# Patient Record
Sex: Male | Born: 1970 | Race: White | Hispanic: No | Marital: Married | Smoking: Never smoker
Health system: Southern US, Community
[De-identification: ages and names within clinical notes are randomized; demographics above are authoritative.]

---

## 2016-06-23 ENCOUNTER — Ambulatory Visit: Payer: Self-pay | Admitting: Allergy

## 2016-12-01 ENCOUNTER — Encounter: Payer: Self-pay | Admitting: Orthopedic Surgery

## 2016-12-01 ENCOUNTER — Ambulatory Visit (INDEPENDENT_AMBULATORY_CARE_PROVIDER_SITE_OTHER): Payer: BC Managed Care – PPO

## 2016-12-01 ENCOUNTER — Ambulatory Visit (INDEPENDENT_AMBULATORY_CARE_PROVIDER_SITE_OTHER): Payer: BC Managed Care – PPO | Admitting: Orthopedic Surgery

## 2016-12-01 VITALS — BP 137/84 | HR 59 | Ht 72.0 in | Wt 215.0 lb

## 2016-12-01 DIAGNOSIS — M79671 Pain in right foot: Secondary | ICD-10-CM

## 2016-12-01 DIAGNOSIS — M7661 Achilles tendinitis, right leg: Secondary | ICD-10-CM

## 2016-12-01 NOTE — Patient Instructions (Addendum)
Achilles Tendinitis Rehab  Do 3 sets of 10 each exercise where indicated hold the count for 1 second  Do exercises once a day for 4 weeks Ask your health care provider which exercises are safe for you. Do exercises exactly as told by your health care provider and adjust them as directed. It is normal to feel mild stretching, pulling, tightness, or discomfort as you do these exercises, but you should stop right away if you feel sudden pain or your pain gets worse. Do not begin these exercises until told by your health care provider. Stretching and range of motion exercises These exercises warm up your muscles and joints and improve the movement and flexibility of your ankle. These exercises also help to relieve pain, numbness, and tingling. Exercise A: Standing wall calf stretch, knee straight 1. Stand with your hands against a wall. 2. Extend your __________ leg behind you and bend your front knee slightly. Keep both of your heels on the floor. 3. Point the toes of your back foot slightly inward. 4. Keeping your heels on the floor and your back knee straight, shift your weight toward the wall. Do not allow your back to arch. You should feel a gentle stretch in your calf. 5. Hold this position for seconds. Repeat __________ times. Complete this stretch __________ times per day. Exercise B: Standing wall calf stretch, knee bent 1. Stand with your hands against a wall. 2. Extend your __________ leg behind you, and bend your front knee slightly. Keep both of your heels on the floor. 3. Point the toes of your back foot slightly inward. 4. Keeping your heels on the floor, unlock your back knee so that it is bent. You should feel a gentle stretch deep in your calf. 5. Hold this position for __________ seconds. Repeat __________ times. Complete this stretch __________ times per day. Strengthening exercises These exercises build strength and control of your ankle. Endurance is the ability to use your  muscles for a long time, even after they get tired. Exercise C: Plantar flexion with band 1. Sit on the floor with your __________ leg extended. You may put a pillow under your calf to give your foot more room to move. 2. Loop a rubber exercise band or tube around the ball of your __________ foot. The ball of your foot is on the walking surface, right under your toes. The band or tube should be slightly tense when your foot is relaxed. If the band or tube slips, you can put on your shoe or put a washcloth between the band and your foot to help it stay in place. 3. Slowly point your toes downward, pushing them away from you. 4. Hold this position for __________ seconds. 5. Slowly release the tension in the band or tube, controlling smoothly until your foot is back to the starting position. Repeat __________ times. Complete this exercise __________ times per day. Exercise D: Heel raise with eccentric lower 1. Stand on a step with the balls of your feet. The ball of your foot is on the walking surface, right under your toes.  Do not put your heels on the step.  For balance, rest your hands on the wall or on a railing. 2. Rise up onto the balls of your feet. 3. Keeping your heels up, shift all of your weight to your __________ leg and pick up your other leg. 4. Slowly lower your __________ leg so your heel drops below the level of the step. 5. Put down  your foot. If told by your health care provider, build up to:  3 sets of 15 repetitions while keeping your knees straight.  3 sets of 15 repetitions while keeping your knees bent as far as told by your health care provider. Complete this exercise __________ times per day. If this exercise is too easy, try doing it while wearing a backpack with weights in it. Balance exercises These exercises improve or maintain your balance. Balance is important in preventing falls. Exercise E: Single leg stand 1. Without shoes, stand near a railing or in a  door frame. Hold on to the railing or door frame as needed. 2. Stand on your __________ foot. Keep your big toe down on the floor and try to keep your arch lifted. 3. Hold this position for __________ seconds. Repeat __________ times. Complete this exercise __________ times per day. If this exercise is too easy, you can try it with your eyes closed or while standing on a pillow. This information is not intended to replace advice given to you by your health care provider. Make sure you discuss any questions you have with your health care provider. Document Released: 05/20/2005 Document Revised: 06/25/2016 Document Reviewed: 06/25/2015 Elsevier Interactive Patient Education  2017 [employer]

## 2016-12-01 NOTE — Progress Notes (Signed)
Patient ID: Jeremiah Fletcher, male   DOB: 03-06-1971, 46 y.o.   MRN: [id number]  Chief Complaint  Patient presents with  . Foot Pain    RIGHT HEEL PAIN    HPI Jeremiah Fletcher is a 46 y.o. male.  The patient reports 6 month history of pain behind his right heel worse when he gets up in the morning worse after exercises. He got to the point where he couldn't walk limp for several weeks then he got a little bit better but he still having difficulty despite taking ibuprofen and resting  It is a dull aching pain is nonradiating it is not had no trauma  Review of Systems Review of Systems  Respiratory: Negative for shortness of breath.   Cardiovascular: Negative for chest pain.  Neurological: Negative for numbness.    Medical history no hypertension or diabetes  The patient reports no prior surgery  Social History Social History  Substance Use Topics  . Smoking status: Never Smoker  . Smokeless tobacco: Never Used  . Alcohol use Not on file    Allergies  Allergen Reactions  . Asa [Aspirin]     Current Meds  Medication Sig  . ALBUTEROL IN Inhale into the lungs.      Physical Exam Physical Exam BP 137/84   Pulse (!) 59   Ht 6' (1.829 m)   Wt 215 lb (97.5 kg)   BMI 29.16 kg/m   Gen. appearance. The patient is well-developed and well-nourished, grooming and hygiene are normal. There are no gross congenital abnormalities  The patient is alert and oriented to person place and time  Mood and affect are normal  Ambulation Is normal  Examination reveals the following: The right heel has a tender area on the posterior aspect of the Achilles insertion with nontender nonswollen Achilles tendon normal ankle range of motion normal stability normal strength normal skin normal sensation normal pulse and perfusion    Data Reviewed Normal foot x-ray except for spur in the Achilles  Assessment    Encounter Diagnoses  Name Primary?  . Pain of right heel Yes  . Tendonitis, Achilles,  right        Plan    Recommend ankle exercises  Ibuprofen 800 mg 3 times a day over-the-counter  Large Cam Walker  Four-week follow-up       Fuller CanadaStanley Harrison 12/01/2016, 4:27 PM

## 2016-12-29 ENCOUNTER — Ambulatory Visit: Payer: BC Managed Care – PPO | Admitting: Orthopedic Surgery

## 2017-03-13 ENCOUNTER — Other Ambulatory Visit (HOSPITAL_COMMUNITY): Payer: Self-pay | Admitting: Pulmonary Disease

## 2017-03-13 ENCOUNTER — Ambulatory Visit (HOSPITAL_COMMUNITY)
Admission: RE | Admit: 2017-03-13 | Discharge: 2017-03-13 | Disposition: A | Discharge status: Home / Self Care | Payer: BC Managed Care – PPO | Source: Ambulatory Visit | Attending: Pulmonary Disease | Admitting: Pulmonary Disease

## 2017-03-13 DIAGNOSIS — M5136 Other intervertebral disc degeneration, lumbar region: Secondary | ICD-10-CM | POA: Diagnosis not present

## 2017-03-13 DIAGNOSIS — M545 Low back pain: Secondary | ICD-10-CM

## 2017-03-24 ENCOUNTER — Ambulatory Visit: Payer: BC Managed Care – PPO | Admitting: Gastroenterology

## 2017-03-24 ENCOUNTER — Telehealth: Payer: Self-pay

## 2017-03-24 NOTE — Telephone Encounter (Signed)
Gastroenterology Pre-Procedure Review  Request Date: 03/24/2017 Requesting Physician:   PATIENT REVIEW QUESTIONS: The patient responded to the following health history questions as indicated:    1. Diabetes Melitis: NO 2. Joint replacements in the past 12 months: no 3. Major health problems in the past 3 months: no 4. Has an artificial valve or MVP: no 5. Has a defibrillator: no 6. Has been advised in past to take antibiotics in advance of a procedure like teeth cleaning: no 7. Family history of colon cancer: no  8. Alcohol Use: no 9. History of sleep apnea: no  10. History of coronary artery or other vascular stents placed within the last 12 months: no    MEDICATIONS & ALLERGIES:    Patient reports the following regarding taking any blood thinners:   Plavix? no Aspirin? no Coumadin? no Brilinta? no Xarelto? no Eliquis? no Pradaxa? no Savaysa? no Effient? no  Patient confirms/reports the following medications:  Current Outpatient Prescriptions  Medication Sig Dispense Refill  . ALBUTEROL IN Inhale into the lungs.     No current facility-administered medications for this visit.     Patient confirms/reports the following allergies:  Allergies  Allergen Reactions  . Asa [Aspirin]     No orders of the defined types were placed in this encounter.   AUTHORIZATION INFORMATION Primary Insurance:  ID #:   Group #:  Pre-Cert / Auth required:  Pre-Cert / Auth #:   Secondary Insurance:  ID #:   Group #:  Pre-Cert / Auth required:  Pre-Cert / Auth #:   SCHEDULE INFORMATION: Procedure has been scheduled as follows:  Date:   04/15/2017             Time:  1:00 pm Location: [facility] Short Stay  This Gastroenterology Pre-Precedure Review Form is being routed to the following provider(s): R. Roetta SessionsMichael Rourk, MD

## 2017-03-24 NOTE — Telephone Encounter (Signed)
Ok to schedule with rmr. Per AB, patient having rectal bleeding and RMR ok'd triage.

## 2017-03-25 ENCOUNTER — Other Ambulatory Visit: Payer: Self-pay

## 2017-03-25 DIAGNOSIS — K625 Hemorrhage of anus and rectum: Secondary | ICD-10-CM

## 2017-03-25 NOTE — Telephone Encounter (Signed)
Pt is on the schedule for 04/15/2017 at 1:00 Am with Dr. Jena Gaussourk. Instructions and Movie prep given to [facility].

## 2017-04-13 ENCOUNTER — Telehealth: Payer: Self-pay

## 2017-04-13 NOTE — Telephone Encounter (Signed)
I called [employer] and spoke with Rosemond C who said a PA is not required for the colonoscopy CPT code of [id number].

## 2017-04-15 ENCOUNTER — Ambulatory Visit (HOSPITAL_COMMUNITY)
Admission: RE | Admit: 2017-04-15 | Discharge: 2017-04-15 | Disposition: A | Discharge status: Home / Self Care | Payer: BC Managed Care – PPO | Source: Ambulatory Visit | Attending: Internal Medicine | Admitting: Internal Medicine

## 2017-04-15 ENCOUNTER — Encounter (HOSPITAL_COMMUNITY): Payer: Self-pay | Admitting: *Deleted

## 2017-04-15 ENCOUNTER — Encounter (HOSPITAL_COMMUNITY)
Admission: RE | Disposition: A | Discharge status: Home / Self Care | Payer: Self-pay | Source: Ambulatory Visit | Attending: Internal Medicine

## 2017-04-15 DIAGNOSIS — Q438 Other specified congenital malformations of intestine: Secondary | ICD-10-CM | POA: Insufficient documentation

## 2017-04-15 DIAGNOSIS — K921 Melena: Secondary | ICD-10-CM | POA: Diagnosis not present

## 2017-04-15 DIAGNOSIS — K625 Hemorrhage of anus and rectum: Secondary | ICD-10-CM

## 2017-04-15 HISTORY — PX: COLONOSCOPY: SHX5424

## 2017-04-15 SURGERY — COLONOSCOPY
Anesthesia: Moderate Sedation

## 2017-04-15 MED ORDER — MEPERIDINE HCL 100 MG/ML IJ SOLN
INTRAMUSCULAR | Status: AC
  Filled 2017-04-15: qty 2

## 2017-04-15 MED ORDER — MIDAZOLAM HCL 5 MG/5ML IJ SOLN
INTRAMUSCULAR | Status: AC
  Filled 2017-04-15: qty 10

## 2017-04-15 MED ORDER — MIDAZOLAM HCL 5 MG/5ML IJ SOLN
INTRAMUSCULAR | Status: DC | PRN
  Administered 2017-04-15 (×2): 1 mg via INTRAVENOUS
  Administered 2017-04-15: 1.5 mg via INTRAVENOUS
  Administered 2017-04-15 (×3): 2 mg via INTRAVENOUS

## 2017-04-15 MED ORDER — ONDANSETRON HCL 4 MG/2ML IJ SOLN
INTRAMUSCULAR | Status: DC | PRN
  Administered 2017-04-15: 4 mg via INTRAVENOUS

## 2017-04-15 MED ORDER — ONDANSETRON HCL 4 MG/2ML IJ SOLN
INTRAMUSCULAR | Status: AC
  Filled 2017-04-15: qty 2

## 2017-04-15 MED ORDER — SODIUM CHLORIDE 0.9 % IV SOLN
INTRAVENOUS | Status: DC
Start: 2017-04-15 — End: 2017-04-15
  Administered 2017-04-15: 13:00:00 via INTRAVENOUS

## 2017-04-15 MED ORDER — MEPERIDINE HCL 100 MG/ML IJ SOLN
INTRAMUSCULAR | Status: DC | PRN
  Administered 2017-04-15 (×3): 50 mg via INTRAVENOUS

## 2017-04-15 NOTE — Op Note (Signed)
[facility] Patient Name: Jeremiah Fletcher Procedure Date: 04/15/2017 1:08 PM MRN: [id number] Date of Birth: 1971/10/22 Attending MD: Gennette Pacobert Michael Amiliana Foutz , MD CSN: [id number] Age: [age] Admit Type: Outpatient Procedure:                Colonoscopy Indications:              Hematochezia Providers:                Gennette Pacobert Michael Meital Riehl, MD, Jannett CelestineAnitra Bell, RN, Burke Keelsrisann                            Tilley, Technician Referring MD:              Medicines:                Midazolam 9.5 mg IV, Meperidine 150 mg IV,                            Ondansetron 4 mg IV Complications:            No immediate complications. Estimated Blood Loss:     Estimated blood loss: none. Procedure:                Pre-Anesthesia Assessment:                           - Prior to the procedure, a History and Physical                            was performed, and patient medications and                            allergies were reviewed. The patient's tolerance of                            previous anesthesia was also reviewed. The risks                            and benefits of the procedure and the sedation                            options and risks were discussed with the patient.                            All questions were answered, and informed consent                            was obtained. Prior Anticoagulants: The patient has                            taken no previous anticoagulant or antiplatelet                            agents. ASA Grade Assessment: I - A normal, healthy  patient. After reviewing the risks and benefits,                            the patient was deemed in satisfactory condition to                            undergo the procedure.                           After obtaining informed consent, the colonoscope                            was passed under direct vision. Throughout the                            procedure, the patient's blood pressure, pulse, and                    oxygen saturations were monitored continuously. The                            EC-[id number] ([id number]) scope was introduced through                            the anus and advanced to the the ileocecal valve.                            The colonoscopy was technically difficult and                            complex due to inadequate bowel prep. The patient                            tolerated the procedure well. The quality of the                            bowel preparation was inadequate. The appendiceal                            orifice and the rectum were photographed. Scope In: 1:44:07 PM Scope Out: 2:17:53 PM Scope Withdrawal Time: 0 hours 12 minutes 23 seconds  Total Procedure Duration: 0 hours 33 minutes 46 seconds  Findings:      The perianal and digital rectal examinations were normal.      The colon (entire examined portion) was moderately redundant. Colon       Elongated. Viscous stool and vegetable matter was scattered throughout       the colon. This precluded complete examination.The cecum was reached.      The exam was otherwise without abnormality on direct and retroflexion       views. Impression:               - Preparation of the colon was inadequate.                           - Redundant colon. INCOMPLETE EXAM                           -  The examination was otherwise normal on direct                            and retroflexion views.                           - No specimens collected. I suspect the patient is                            suffering from significant constipation. Likely                            bled from benign anal rectal irritation However, a                            repeat colonoscopy is appropriate. Moderate Sedation:      Moderate (conscious) sedation was administered by the endoscopy nurse       and supervised by the endoscopist. The following parameters were       monitored: oxygen saturation, heart rate, blood pressure,  respiratory       rate, EKG, adequacy of pulmonary ventilation, and response to care.       Total physician intraservice time was 47 minutes. Recommendation:           - Patient has a contact number available for                            emergencies. The signs and symptoms of potential                            delayed complications were discussed with the                            patient. Return to normal activities tomorrow.                            Written discharge instructions were provided to the                            patient.                           - Resume previous diet.                           - Continue present medications. Begin linzess 145                            daily?samples available.                           - Repeat colonoscopy (date not yet determined)                            because the bowel preparation was suboptimal.                           -  Return to GI clinic in 4 weeks. Procedure Code(s):        --- Professional ---                           [phone], Colonoscopy, flexible; diagnostic, including                            collection of specimen(s) by brushing or washing,                            when performed (separate procedure)                           99152, Moderate sedation services provided by the                            same physician or other qualified health care                            professional performing the diagnostic or                            therapeutic service that the sedation supports,                            requiring the presence of an independent trained                            observer to assist in the monitoring of the                            patient's level of consciousness and physiological                            status; initial 15 minutes of intraservice time,                            patient age 76 years or older                           [phone], Moderate sedation services; each  additional                            15 minutes intraservice time                           99153, Moderate sedation services; each additional                            15 minutes intraservice time Diagnosis Code(s):        --- Professional ---                           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 All rights reserved. The codes documented in this report are preliminary and upon coder review may  be revised to meet current compliance requirements. Gerrit Friends. Isma Tietje, MD Gennette Pac, MD 04/15/2017 2:52:30 PM This report has been signed electronically. Number of Addenda: 0

## 2017-04-15 NOTE — H&P (Signed)
@  [email or web address]@   Primary Care Physician:  Kari BaarsHawkins, Edward, MD Primary Gastroenterologist:  Dr. Jena Gaussourk  Pre-Procedure History & Physical: HPI:  Jeremiah Fletcher is a 46 y.o. male here for diagnostic colonoscopy.  2-3 episodes of intermittent paper hematochezia throughout his lifetime had an episode earlier this year. Feels that stool is too large he feels a tearing sensation. Denies diarrhea with bowel movement daily to every other day. No diarrhea. No family history of colon polyps and colorectal neoplasia.    History reviewed. No pertinent past medical history.  History reviewed. No pertinent surgical history.  Prior to Admission medications   Medication Sig Start Date End Date Taking? Authorizing Provider  ibuprofen (ADVIL,MOTRIN) 200 MG tablet Take 400 mg by mouth daily as needed for mild pain.   Yes [provider]  loratadine (CLARITIN) 10 MG tablet Take 10 mg by mouth daily.   Yes [provider]  PROAIR HFA 108 (90 Base) MCG/ACT inhaler Inhale 1 puff into the lungs every 4 (four) hours as needed for shortness of breath or wheezing. 03/15/17  Yes [provider]  Tetrahydrozoline-Zn Sulfate (ALLERGY RELIEF EYE DROPS OP) Place 1 drop into both eyes daily as needed (allergies).   Yes [provider]    Allergies as of 03/25/2017 - Review Complete 03/24/2017  Allergen Reaction Noted  . Asa [aspirin]  12/01/2016    Family History  Problem Relation Age of Onset  . Cancer Father     Social History   Social History  . Marital status: Married    Spouse name: N/A  . Number of children: N/A  . Years of education: N/A   Occupational History  . Not on file.   Social History Main Topics  . Smoking status: Never Smoker  . Smokeless tobacco: Never Used  . Alcohol use No  . Drug use: No  . Sexual activity: Not on file   Other Topics Concern  . Not on file   Social History Narrative  . No narrative on file    Review of Systems: See HPI, otherwise  negative ROS  Physical Exam: BP 123/74   Pulse 71   Temp 97.8 F (36.6 C) (Oral)   Resp 20   Ht 5\' 11"  (1.803 m)   Wt 205 lb (93 kg)   SpO2 96%   BMI 28.59 kg/m  General:   Alert,  Well-developed, well-nourished, pleasant and cooperative in NAD Skin:  Intact without significant lesions or rashes. Neck:  Supple; no masses or thyromegaly. No significant cervical adenopathy. Lungs:  Clear throughout to auscultation.   No wheezes, crackles, or rhonchi. No acute distress. Heart:  Regular rate and rhythm; no murmurs, clicks, rubs,  or gallops. Abdomen: Non-distended, normal bowel sounds.  Soft and nontender without appreciable mass or hepatosplenomegaly.  Pulses:  Normal pulses noted. Extremities:  Without clubbing or edema.  Impression:  46 year old gentleman with the relatively rare episodes of paper hematochezia. There may be an element of constipation here. I agree, further evaluation warranted via colonoscopy.  Recommendations:  The patient diagnostic colonoscopy today.  The risks, benefits, limitations, alternatives and imponderables have been reviewed with the patient. Questions have been answered. All parties are agreeable.    Notice: This dictation was prepared with Dragon dictation along with smaller phrase technology. Any transcriptional errors that result from this process are unintentional and may not be corrected upon review.

## 2017-04-15 NOTE — Discharge Instructions (Signed)
Colonoscopy Discharge Instructions  Read the instructions outlined below and refer to this sheet in the next few weeks. These discharge instructions provide you with general information on caring for yourself after you leave the hospital. Your doctor may also give you specific instructions. While your treatment has been planned according to the most current medical practices available, unavoidable complications occasionally occur. If you have any problems or questions after discharge, call Dr. Jena Gaussourk at [phone]. ACTIVITY  You may resume your regular activity, but move at a slower pace for the next 24 hours.   Take frequent rest periods for the next 24 hours.   Walking will help get rid of the air and reduce the bloated feeling in your belly (abdomen).   No driving for 24 hours (because of the medicine (anesthesia) used during the test).    Do not sign any important legal documents or operate any machinery for 24 hours (because of the anesthesia used during the test).  NUTRITION  Drink plenty of fluids.   You may resume your normal diet as instructed by your doctor.   Begin with a light meal and progress to your normal diet. Heavy or fried foods are harder to digest and may make you feel sick to your stomach (nauseated).   Avoid alcoholic beverages for 24 hours or as instructed.  MEDICATIONS  You may resume your normal medications unless your doctor tells you otherwise.  WHAT YOU CAN EXPECT TODAY  Some feelings of bloating in the abdomen.   Passage of more gas than usual.   Spotting of blood in your stool or on the toilet paper.  IF YOU HAD POLYPS REMOVED DURING THE COLONOSCOPY:  No aspirin products for 7 days or as instructed.   No alcohol for 7 days or as instructed.   Eat a soft diet for the next 24 hours.  FINDING OUT THE RESULTS OF YOUR TEST Not all test results are available during your visit. If your test results are not back during the visit, make an appointment  with your caregiver to find out the results. Do not assume everything is normal if you have not heard from your caregiver or the medical facility. It is important for you to follow up on all of your test results.  SEEK IMMEDIATE MEDICAL ATTENTION IF:  You have more than a spotting of blood in your stool.   Your belly is swollen (abdominal distention).   You are nauseated or vomiting.   You have a temperature over 101.   You have abdominal pain or discomfort that is severe or gets worse throughout the day.     Your colonoscopy preparation was inadequate today. All of your colon could not be seen. A repeat colonoscopy will be scheduled between now and one year from now  I believe you're suffering some from some degree of constipation.  Bleeding likely from anorectal irritation  Trial of Linzess 145 daily-samples available  Office visit with us in one month  Information constipation provided    Constipation, Adult Constipation is when a person has fewer bowel movements in a week than normal, has difficulty having a bowel movement, or has stools that are dry, hard, or larger than normal. Constipation may be caused by an underlying condition. It may become worse with age if a person takes certain medicines and does not take in enough fluids. Follow these instructions at home: Eating and drinking   Eat foods that have a lot of fiber, such as fresh fruits and  vegetables, whole grains, and beans.  Limit foods that are high in fat, low in fiber, or overly processed, such as french fries, hamburgers, cookies, candies, and soda.  Drink enough fluid to keep your urine clear or pale yellow. General instructions  Exercise regularly or as told by your health care provider.  Go to the restroom when you have the urge to go. Do not hold it in.  Take over-the-counter and prescription medicines only as told by your health care provider. These include any fiber supplements.  Practice  pelvic floor retraining exercises, such as deep breathing while relaxing the lower abdomen and pelvic floor relaxation during bowel movements.  Watch your condition for any changes.  Keep all follow-up visits as told by your health care provider. This is important. Contact a health care provider if:  You have pain that gets worse.  You have a fever.  You do not have a bowel movement after 4 days.  You vomit.  You are not hungry.  You lose weight.  You are bleeding from the anus.  You have thin, pencil-like stools. Get help right away if:  You have a fever and your symptoms suddenly get worse.  You leak stool or have blood in your stool.  Your abdomen is bloated.  You have severe pain in your abdomen.  You feel dizzy or you faint. This information is not intended to replace advice given to you by your health care provider. Make sure you discuss any questions you have with your health care provider. Document Released: 07/17/2004 Document Revised: 05/08/2016 Document Reviewed: 04/08/2016 Elsevier Interactive Patient Education  2017 [employer].

## 2017-04-20 ENCOUNTER — Encounter (HOSPITAL_COMMUNITY): Payer: Self-pay | Admitting: Internal Medicine

## 2017-06-10 ENCOUNTER — Encounter: Payer: Self-pay | Admitting: Internal Medicine

## 2017-11-14 ENCOUNTER — Telehealth: Payer: BC Managed Care – PPO | Admitting: Physician Assistant

## 2017-11-14 DIAGNOSIS — B9689 Other specified bacterial agents as the cause of diseases classified elsewhere: Secondary | ICD-10-CM

## 2017-11-14 DIAGNOSIS — J019 Acute sinusitis, unspecified: Secondary | ICD-10-CM

## 2017-11-14 MED ORDER — AMOXICILLIN-POT CLAVULANATE 875-125 MG PO TABS: 1.0000 | ORAL_TABLET | Freq: Two times a day (BID) | ORAL | 0 refills | Status: DC

## 2017-11-14 NOTE — Progress Notes (Signed)

## 2018-04-25 IMAGING — DX DG LUMBAR SPINE COMPLETE 4+V
5 series · 5 of 5 positions shown · non-contrast
Comparison: None.

CLINICAL DATA: Pain in lower back, groin and hips, radiating down
the legs. No injury.

EXAM:
LUMBAR SPINE - COMPLETE 4+ VIEW

[l-spine ap]
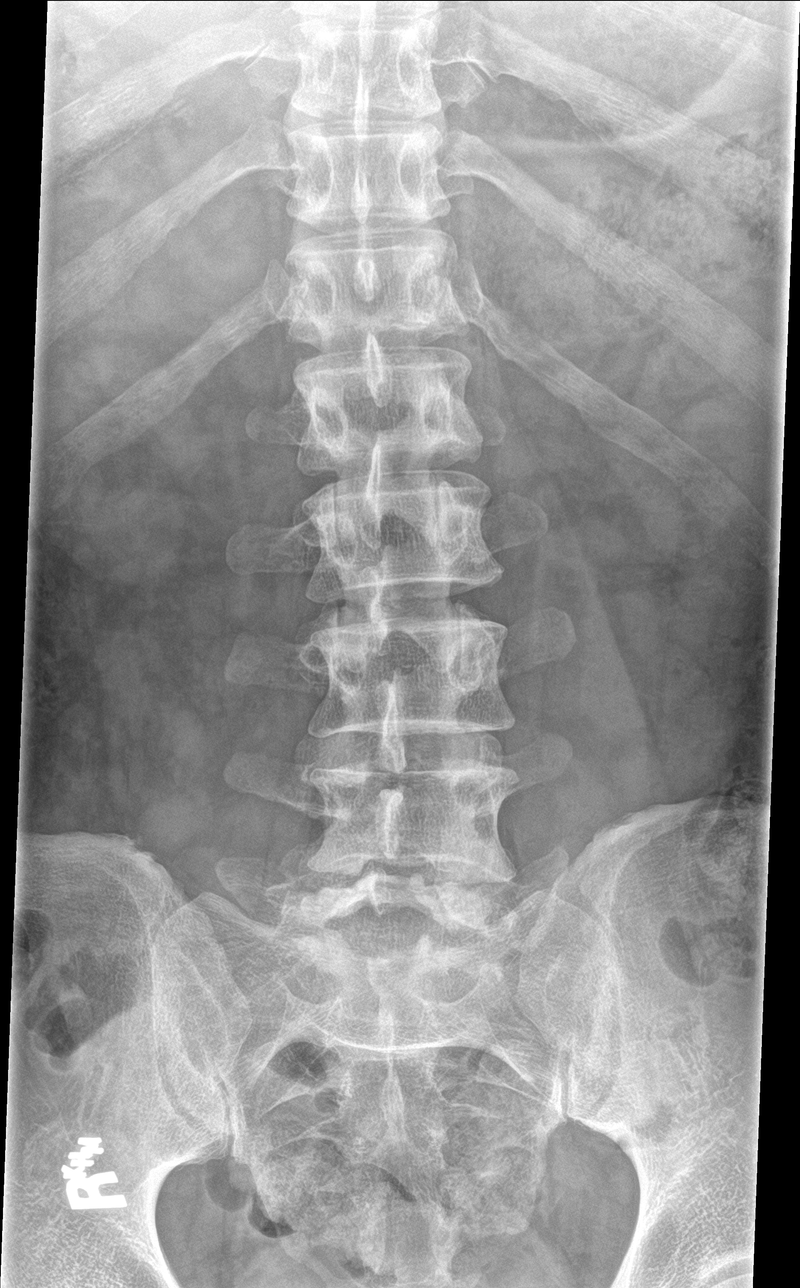

[l-spine obl (1 of 2)]
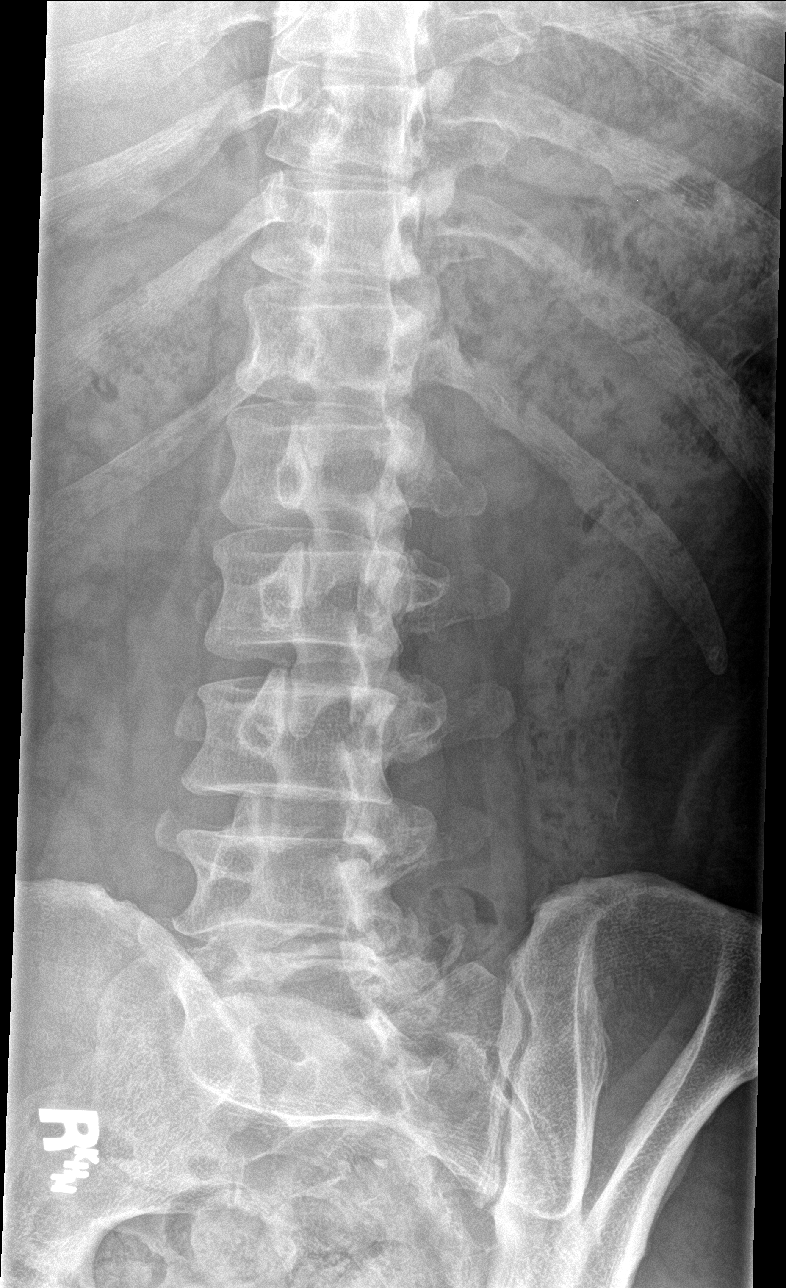

[l-spine obl (2 of 2)]
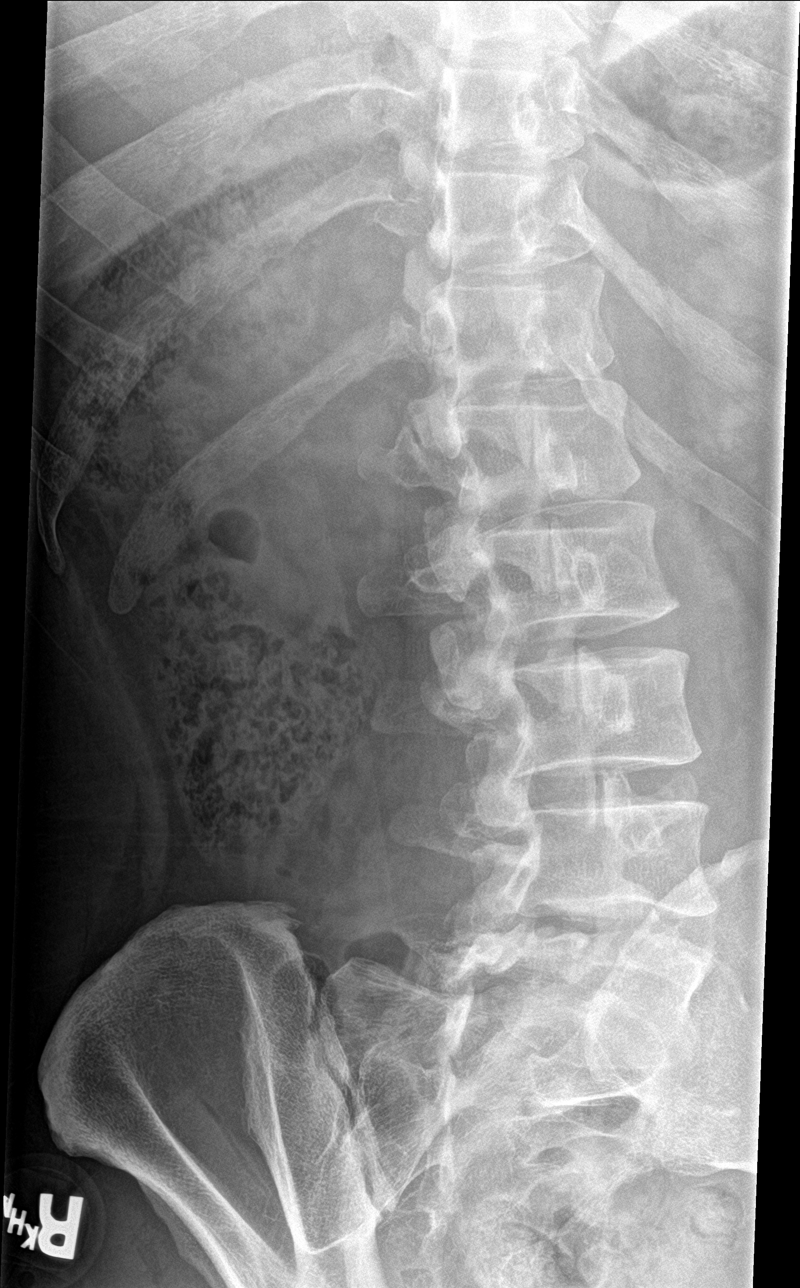

[l-spine lat]
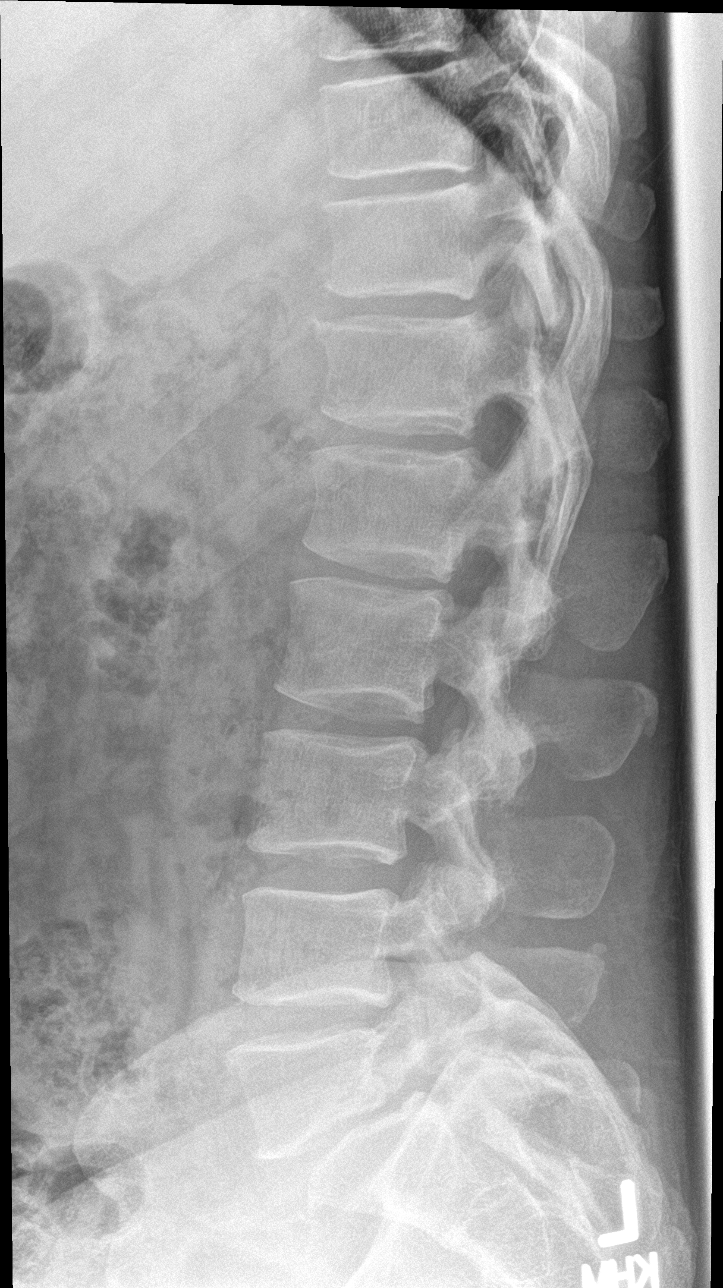

[l-spine spot]
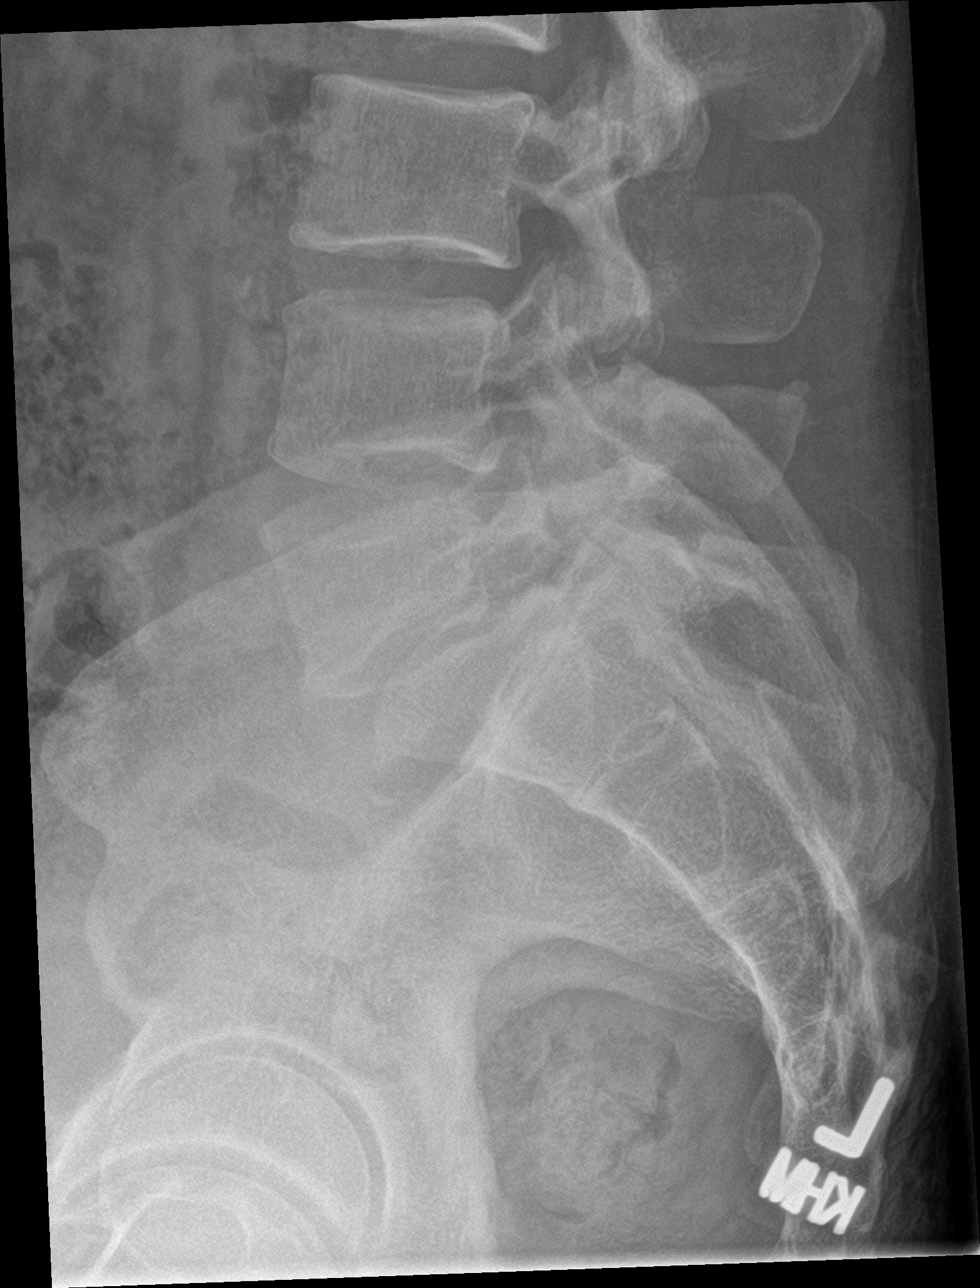

[5 of 5 positions shown; findings below may reference images not displayed]

FINDINGS: Mild levoconvex curvature of the lumbar spine. Grade 1
anterolisthesis of L5 on S1 secondary to pars defects. Associated
endplate degenerative changes and loss of disc space height.
Alignment is otherwise anatomic. Vertebral body and disc space
heights are otherwise maintained.
IMPRESSION: Grade 1 anterolisthesis of L5 on S1 secondary to pars defects, with
associated degenerative disc disease.

## 2021-12-29 ENCOUNTER — Other Ambulatory Visit: Payer: Self-pay

## 2021-12-29 ENCOUNTER — Encounter (HOSPITAL_BASED_OUTPATIENT_CLINIC_OR_DEPARTMENT_OTHER): Payer: Self-pay | Admitting: Obstetrics and Gynecology

## 2021-12-29 ENCOUNTER — Emergency Department (HOSPITAL_BASED_OUTPATIENT_CLINIC_OR_DEPARTMENT_OTHER)
Admission: EM | Admit: 2021-12-29 | Discharge: 2021-12-29 | Disposition: A | Discharge status: Home / Self Care | Payer: BC Managed Care – PPO | Attending: Student | Admitting: Student

## 2021-12-29 ENCOUNTER — Emergency Department (HOSPITAL_BASED_OUTPATIENT_CLINIC_OR_DEPARTMENT_OTHER): Payer: BC Managed Care – PPO

## 2021-12-29 ENCOUNTER — Other Ambulatory Visit (HOSPITAL_BASED_OUTPATIENT_CLINIC_OR_DEPARTMENT_OTHER): Payer: Self-pay

## 2021-12-29 DIAGNOSIS — N132 Hydronephrosis with renal and ureteral calculous obstruction: Secondary | ICD-10-CM | POA: Insufficient documentation

## 2021-12-29 DIAGNOSIS — N200 Calculus of kidney: Secondary | ICD-10-CM

## 2021-12-29 DIAGNOSIS — R8289 Other abnormal findings on cytological and histological examination of urine: Secondary | ICD-10-CM | POA: Diagnosis not present

## 2021-12-29 DIAGNOSIS — R1012 Left upper quadrant pain: Secondary | ICD-10-CM | POA: Diagnosis present

## 2021-12-29 LAB — COMPREHENSIVE METABOLIC PANEL
ALT: 16 U/L (ref 0–44)
AST: 19 U/L (ref 15–41)
Albumin: 4.4 g/dL (ref 3.5–5.0)
Alkaline Phosphatase: 65 U/L (ref 38–126)
Anion gap: 9 (ref 5–15)
BUN: 19 mg/dL (ref 6–20)
CO2: 27 mmol/L (ref 22–32)
Calcium: 9.7 mg/dL (ref 8.9–10.3)
Chloride: 103 mmol/L (ref 98–111)
Creatinine, Ser: 1.11 mg/dL (ref 0.61–1.24)
GFR, Estimated: >60 mL/min (ref >60)
Glucose, Bld: 130 mg/dL — ABNORMAL HIGH (ref 70–99)
Potassium: 4.2 mmol/L (ref 3.5–5.1)
Sodium: 139 mmol/L (ref 135–145)
Total Bilirubin: 0.5 mg/dL (ref 0.3–1.2)
Total Protein: 7.6 g/dL (ref 6.5–8.1)

## 2021-12-29 LAB — CBC WITH DIFFERENTIAL/PLATELET
Abs Immature Granulocytes: 0.03 10*3/uL (ref 0.00–0.07)
Basophils Absolute: 0.1 10*3/uL (ref 0.0–0.1)
Basophils Relative: 1 %
Eosinophils Absolute: 0.1 10*3/uL (ref 0.0–0.5)
Eosinophils Relative: 1 %
HCT: 47 % (ref 39.0–52.0)
Hemoglobin: 15.3 g/dL (ref 13.0–17.0)
Immature Granulocytes: 0 %
Lymphocytes Relative: 9 %
Lymphs Abs: 0.9 10*3/uL (ref 0.7–4.0)
MCH: 29.1 pg (ref 26.0–34.0)
MCHC: 32.6 g/dL (ref 30.0–36.0)
MCV: 89.4 fL (ref 80.0–100.0)
Monocytes Absolute: 0.7 10*3/uL (ref 0.1–1.0)
Monocytes Relative: 7 %
Neutro Abs: 8.3 10*3/uL — ABNORMAL HIGH (ref 1.7–7.7)
Neutrophils Relative %: 82 %
Platelets: 238 10*3/uL (ref 150–400)
RBC: 5.26 MIL/uL (ref 4.22–5.81)
RDW: 13.2 % (ref 11.5–15.5)
WBC: 10.1 10*3/uL (ref 4.0–10.5)
nRBC: 0 % (ref 0.0–0.2)

## 2021-12-29 LAB — LIPASE, BLOOD: Lipase: 22 U/L (ref 11–51)

## 2021-12-29 LAB — URINALYSIS, ROUTINE W REFLEX MICROSCOPIC
Bilirubin Urine: NEGATIVE
Glucose, UA: NEGATIVE mg/dL
Ketones, ur: NEGATIVE mg/dL
Leukocytes,Ua: NEGATIVE
Nitrite: NEGATIVE
RBC / HPF: >50 RBC/hpf — ABNORMAL HIGH (ref 0–5)
Specific Gravity, Urine: 1.026 (ref 1.005–1.030)
pH: 5.5 (ref 5.0–8.0)

## 2021-12-29 MED ORDER — ONDANSETRON HCL 4 MG/2ML IJ SOLN
4.0000 mg | Freq: Once | INTRAMUSCULAR | Status: AC | PRN
  Administered 2021-12-29: 4 mg via INTRAVENOUS
  Filled 2021-12-29: qty 2

## 2021-12-29 MED ORDER — ALUM & MAG HYDROXIDE-SIMETH 200-200-20 MG/5ML PO SUSP
30.0000 mL | Freq: Once | ORAL | Status: AC
  Administered 2021-12-29: 30 mL via ORAL
  Filled 2021-12-29: qty 30

## 2021-12-29 MED ORDER — SODIUM CHLORIDE 0.9 % IV BOLUS
1000.0000 mL | Freq: Once | INTRAVENOUS | Status: AC
  Administered 2021-12-29: 1000 mL via INTRAVENOUS

## 2021-12-29 MED ORDER — KETOROLAC TROMETHAMINE 15 MG/ML IJ SOLN
15.0000 mg | Freq: Once | INTRAMUSCULAR | Status: AC
  Administered 2021-12-29: 15 mg via INTRAVENOUS
  Filled 2021-12-29: qty 1

## 2021-12-29 MED ORDER — TAMSULOSIN HCL 0.4 MG PO CAPS
0.4000 mg | ORAL_CAPSULE | Freq: Once | ORAL | Status: AC
  Administered 2021-12-29: 0.4 mg via ORAL
  Filled 2021-12-29: qty 1

## 2021-12-29 MED ORDER — IOHEXOL 300 MG/ML  SOLN
100.0000 mL | Freq: Once | INTRAMUSCULAR | Status: AC | PRN
  Administered 2021-12-29: 85 mL via INTRAVENOUS

## 2021-12-29 MED ORDER — ONDANSETRON 4 MG PO TBDP
4.0000 mg | ORAL_TABLET | Freq: Three times a day (TID) | ORAL | 0 refills | Status: DC | PRN
  Filled 2021-12-29: qty 5, 2d supply, fill #0

## 2021-12-29 MED ORDER — HYDROCODONE-ACETAMINOPHEN 5-325 MG PO TABS
1.0000 | ORAL_TABLET | Freq: Four times a day (QID) | ORAL | 0 refills | Status: DC | PRN
  Filled 2021-12-29: qty 10, 3d supply, fill #0

## 2021-12-29 MED ORDER — TAMSULOSIN HCL 0.4 MG PO CAPS
0.4000 mg | ORAL_CAPSULE | Freq: Every day | ORAL | 0 refills | Status: DC
  Filled 2021-12-29: qty 10, 10d supply, fill #0

## 2021-12-29 MED ORDER — LIDOCAINE VISCOUS HCL 2 % MT SOLN
15.0000 mL | Freq: Once | OROMUCOSAL | Status: AC
  Administered 2021-12-29: 15 mL via ORAL
  Filled 2021-12-29: qty 15

## 2021-12-29 NOTE — ED Notes (Signed)
Pt d/c home per MD order. Discharge summary reviewed, pt verbalizes understanding. Ambulatory off unit. No s/s of acute distress noted at discharge.  °

## 2021-12-29 NOTE — ED Provider Notes (Signed)
[facility] Provider Note   CSN: [id number] Arrival date & time: 12/29/21  [id number]     History  Chief Complaint  Patient presents with   Abdominal Pain    Jeremiah Fletcher is a 51 y.o. 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  He describes pain as sharp, moderate intensity, no clear aggravating or relieving factors, no radiation of pain, he denies similar pain in the past.  Reports associated symptom is nausea.  Denies fever, chills, chest pain/shortness of breath, vomiting, diarrhea, dysuria/hematuria or any additional concerns.  HPI     Home Medications Prior to Admission medications   Medication Sig Start Date End Date Taking? Authorizing Provider  HYDROcodone-acetaminophen (NORCO/VICODIN) 5-325 MG tablet Take 1 tablet by mouth every 6 (six) hours as needed for severe pain. 12/29/21  Yes Nuala Alpha A, PA-C  ondansetron (ZOFRAN-ODT) 4 MG disintegrating tablet Take 1 tablet (4 mg total) by mouth every 8 (eight) hours as needed for nausea or vomiting. 12/29/21  Yes Nuala Alpha A, PA-C  tamsulosin (FLOMAX) 0.4 MG CAPS capsule Take 1 capsule (0.4 mg total) by mouth daily. 12/29/21  Yes Nuala Alpha A, PA-C  amoxicillin-clavulanate (AUGMENTIN) 875-125 MG tablet Take 1 tablet by mouth 2 (two) times daily. 11/14/17   Brunetta Jeans, PA-C  ibuprofen (ADVIL,MOTRIN) 200 MG tablet Take 400 mg by mouth daily as needed for mild pain.    [provider]  loratadine (CLARITIN) 10 MG tablet Take 10 mg by mouth daily.    [provider]  PROAIR HFA 108 (90 Base) MCG/ACT inhaler Inhale 1 puff into the lungs every 4 (four) hours as needed for shortness of breath or wheezing. 03/15/17   [provider]  Tetrahydrozoline-Zn Sulfate (ALLERGY RELIEF EYE DROPS OP)  Place 1 drop into both eyes daily as needed (allergies).    [provider]      Allergies    Asa [aspirin]    Review of Systems   Review of Systems Ten systems are reviewed and are negative for acute change except as noted in the HPI  Physical Exam Updated Vital Signs BP 139/87    Pulse 66    Temp 97.8 F (36.6 C) (Oral)    Resp 20    Ht 5\' 11"  (1.803 m)    Wt 90.2 kg    SpO2 95%    BMI 27.73 kg/m  Physical Exam Constitutional:      General: He is not in acute distress.    Appearance: Normal appearance. He is well-developed. He is not ill-appearing or diaphoretic.  HENT:     Head: Normocephalic and atraumatic.  Eyes:     General: Vision grossly intact. Gaze aligned appropriately.     Pupils: Pupils are equal, round, and reactive to light.  Neck:     Trachea: Trachea and phonation normal.  Pulmonary:     Effort: Pulmonary effort is normal. No respiratory distress.  Abdominal:     General: There is no distension.     Palpations: Abdomen is soft.     Tenderness: There is abdominal tenderness in the left upper quadrant. There is no guarding or rebound.  Musculoskeletal:        General: Normal range of motion.     Cervical back: Normal range of motion.  Skin:    General:  Skin is warm and dry.  Neurological:     Mental Status: He is alert.     GCS: GCS eye subscore is 4. GCS verbal subscore is 5. GCS motor subscore is 6.     Comments: Speech is clear and goal oriented, follows commands Major Cranial nerves without deficit, no facial droop Moves extremities without ataxia, coordination intact  Psychiatric:        Behavior: Behavior normal.    ED Results / Procedures / Treatments   Labs (all labs ordered are listed, but only abnormal results are displayed) Labs Reviewed  CBC WITH DIFFERENTIAL/PLATELET - Abnormal; Notable for the following components:      Result Value   Neutro Abs 8.3 (*)    All other components within normal limits  COMPREHENSIVE METABOLIC  PANEL - Abnormal; Notable for the following components:   Glucose, Bld 130 (*)    All other components within normal limits  URINALYSIS, ROUTINE W REFLEX MICROSCOPIC - Abnormal; Notable for the following components:   Hgb urine dipstick LARGE (*)    Protein, ur TRACE (*)    RBC / HPF >50 (*)    Bacteria, UA FEW (*)    All other components within normal limits  LIPASE, BLOOD  CBC    EKG EKG Interpretation  Date/Time:  Monday December 29 2021 08:57:46 EST Ventricular Rate:  54 PR Interval:  143 QRS Duration: 86 QT Interval:  406 QTC Calculation: 385 R Axis:   59 Text Interpretation: Sinus rhythm Confirmed by Kommor, Madison ([id number]) on 12/29/2021 9:06:40 AM  Radiology CT ABDOMEN PELVIS W CONTRAST  Result Date: 12/29/2021 CLINICAL DATA:  Acute nonlocalized LEFT side abdominal pain, LEFT lower quadrant pain as worsened over last 12 hours, some nausea, denies diarrhea EXAM: CT ABDOMEN AND PELVIS WITH CONTRAST TECHNIQUE: Multidetector CT imaging of the abdomen and pelvis was performed using the standard protocol following bolus administration of intravenous contrast. RADIATION DOSE REDUCTION: This exam was performed according to the departmental dose-optimization program which includes automated exposure control, adjustment of the mA and/or kV according to patient size and/or use of iterative reconstruction technique. CONTRAST:  73mL OMNIPAQUE IOHEXOL 300 MG/ML SOLN IV. No oral contrast. COMPARISON:  None FINDINGS: Lower chest: Minimal dependent atelectasis at posterior lung bases. Hepatobiliary: Gallbladder and liver normal appearance Pancreas: Normal appearance Spleen: Normal appearance Adrenals/Urinary Tract: Adrenal glands normal appearance. 4 mm nonobstructing calculus inferior pole RIGHT kidney. Tiny cyst upper pole LEFT kidney. No additional renal masses. Mild enlargement of LEFT kidney with delay in nephrogram and perinephric edema, associated with hydronephrosis. 4 mm proximal LEFT  ureteral calculus. Ureters and bladder otherwise normal appearance. Stomach/Bowel: Normal appendix. Stomach and bowel loops normal appearance Vascular/Lymphatic: Atherosclerotic calcifications iliac arteries. Aorta normal caliber. No adenopathy. Reproductive: Prominent prostate gland 4.7 x 4.0 x 3.3 cm (volume = 32 cm^3). Other: No free air or free fluid.  No hernia. Musculoskeletal: Grade 1 spondylolisthesis L5-S1 secondary to BILATERAL spondylolysis L5. IMPRESSION: LEFT hydronephrosis, proximal hydroureter, and delay in LEFT nephrogram secondary to a 4 mm proximal LEFT ureteral calculus. Additional nonobstructing RIGHT inferior pole renal calculus. Grade 1 spondylolisthesis L5-S1 secondary to BILATERAL spondylolysis L5. Mild prostatic enlargement. Electronically Signed   By: Ulyses Southward M.D.   On: 12/29/2021 10:52    Procedures Procedures    Medications Ordered in ED Medications  ondansetron (ZOFRAN) injection 4 mg (4 mg Intravenous Given 12/29/21 0847)  alum & mag hydroxide-simeth (MAALOX/MYLANTA) 200-200-20 MG/5ML suspension 30 mL (30 mLs Oral Given 12/29/21 0851)  And  lidocaine (XYLOCAINE) 2 % viscous mouth solution 15 mL (15 mLs Oral Given 12/29/21 0851)  sodium chloride 0.9 % bolus 1,000 mL (0 mLs Intravenous Stopped 12/29/21 0953)  iohexol (OMNIPAQUE) 300 MG/ML solution 100 mL (85 mLs Intravenous Contrast Given 12/29/21 1021)  ketorolac (TORADOL) 15 MG/ML injection 15 mg (15 mg Intravenous Given 12/29/21 1034)  tamsulosin (FLOMAX) capsule 0.4 mg (0.4 mg Oral Given 12/29/21 1057)    ED Course/ Medical Decision Making/ A&P                           Medical Decision Making Patient presented for left-sided abdominal pain onset this morning.  Abdominal pain labs ordered.  Initially patient had left upper quadrant abdominal pain, possibly of GERD as etiology, patient wished to attempt symptomatic management before pursuing CT imaging.  Patient was given a GI cocktail without improvement of his  symptoms, shortly afterwards again shared decision making made with patient regarding CT imaging, risk versus benefits were discussed, patient elected to proceed with CT imaging to assess for causes of left-sided abdominal pain.  CT imaging revealed a left-sided kidney stone causing hydronephrosis/hydroureter.  Labs were overall reassuring, no evidence for infection.  No indication for admission.  Patient appears appropriate for outpatient management of left-sided kidney stone disease.  Amount and/or Complexity of Data Reviewed Labs: ordered.    Details: CBC without leukocytosis to suggest infectious process, no anemia or thrombocytopenia. CMP shows no emergent electrolyte derangement, AKI, LFT elevations or gap. Lipase normal limits, no evidence for pancreatitis. Urinalysis shows greater than 50 RBCs.  Few bacteria, no WBCs, leukocytes or nitrites.  Appears consistent with kidney stone disease, no evidence for UTI. Radiology: ordered.    Details: CT abdomen pelvis with contrast IMPRESSION: LEFT hydronephrosis, proximal hydroureter, and delay in LEFT nephrogram secondary to a 4 mm proximal LEFT ureteral calculus.   Additional nonobstructing RIGHT inferior pole renal calculus.   Grade 1 spondylolisthesis L5-S1 secondary to BILATERAL spondylolysis L5.   Mild prostatic enlargement. ECG/medicine tests: ordered.    Details: No acute ischemic changes reviewed by Dr. Matilde Sprang.  Risk OTC drugs. Prescription drug management.   51 year old male was found to have a left-sided obstructive kidney stone causing left hydroureter and hydronephrosis.  This finding was discussed with the patient in detail.  He was given a dose of Toradol, he reports significant improvement of his pain.  On reassessment patient is well-appearing in no acute distress and is requesting discharge.  We will send patient home with a short prescription of Norco for acute pain due to kidney stone, I reviewed narcotic database and  patient has had no prior narcotic prescriptions.  I discussed narcotic precautions with the patient and he stated understanding.  Patient was also given a prescription for Zofran 4 mg ODT as needed nausea vomiting.  He was also given a prescription for tamsulosin.  Patient was given referral to Dr. Jeffie Pollock at [facility] urology, I asked the patient to call their office today to schedule follow-up appointment for further evaluation and treatment of kidney stone disease.  At discharge patient is well-appearing and in no acute distress, vital signs within normal limits he has no additional concerns at this point.  At this time there does not appear to be any evidence of an acute emergency medical condition and the patient appears stable for discharge with appropriate outpatient follow up. Diagnosis was discussed with patient who verbalizes understanding of care plan and is agreeable  to discharge. I have discussed return precautions with patient who verbalizes understanding. Patient encouraged to follow-up with their PCP and urology. All questions answered.  Patient's case discussed with Dr. Matilde Sprang who agrees with plan to discharge with follow-up.   Note: Portions of this report may have been transcribed using voice recognition software. Every effort was made to ensure accuracy; however, inadvertent computerized transcription errors may still be present.         Final Clinical Impression(s) / ED Diagnoses Final diagnoses:  Nephrolithiasis    Rx / DC Orders ED Discharge Orders          Ordered    HYDROcodone-acetaminophen (NORCO/VICODIN) 5-325 MG tablet  Every 6 hours PRN        12/29/21 1112    ondansetron (ZOFRAN-ODT) 4 MG disintegrating tablet  Every 8 hours PRN        12/29/21 1112    tamsulosin (FLOMAX) 0.4 MG CAPS capsule  Daily        12/29/21 1112              Gari Crown 12/29/21 1121    Kommor, Debe Coder, MD 12/29/21 1533

## 2021-12-29 NOTE — Discharge Instructions (Addendum)
At this time there does not appear to be the presence of an emergent medical condition, however there is always the potential for conditions to change. Please read and follow the below instructions.  Please return to the Emergency Department immediately for any new or worsening symptoms or if your symptoms do not improve within 2 days. Please be sure to follow up with your Primary Care Provider within one week regarding your visit today; please call their office to schedule an appointment even if you are feeling better for a follow-up visit. You may take the medication Norco (Hydrocodone/Acetaminophen) as prescribed to help with severe pain.  This medication will make you drowsy so do not drive, drink alcohol, take other sedating medications or perform any dangerous activities such as driving after taking Norco. Norco contains Tylenol (acetaminophen) so do not take any other Tylenol-containing products with Norco. You have been given an NSAID-containing medication called Toradol today.  Do not take the medications including ibuprofen, Aleve, Advil, naproxen or other NSAID-containing medications for the next 2 days.  Please be sure to drink plenty of water over the next few days. You may use the nausea medication Zofran as prescribed to help with nausea and vomiting as needed.  This medication will dissolve under your tongue. You may use the medication tamsulosin as prescribed to help facilitate passage of your kidney stone. Please call the urologist Dr. Annabell Howells today to schedule a follow-up appointment for further evaluation and treatment of your kidney stones. Your CT scan today also showed spondylolisthesis of your L5-S1 vertebrae.  Please discuss this with your primary care provider at your follow-up appointment.  Additional findings on your CT scan include a right sided kidney stone which is still in the kidney, atelectasis of the lungs, atherosclerosis of your arteries and a enlarged prostate.  Please  discuss all of these findings with your primary care provider at your follow-up appointment.  Go to the nearest Emergency Department immediately if: You have fever or chills You get very bad pain. You get new pain in your belly (abdomen). You pass out (faint). You cannot pee. You have any new/concerning or worsening of symptoms.    Please read the additional information packets attached to your discharge summary.  Do not take your medicine if  develop an itchy rash, swelling in your mouth or lips, or difficulty breathing; call 911 and seek immediate emergency medical attention if this occurs.  You may review your lab tests and imaging results in their entirety on your MyChart account.  Please discuss all results of fully with your primary care provider and other specialist at your follow-up visit.  Note: Portions of this text may have been transcribed using voice recognition software. Every effort was made to ensure accuracy; however, inadvertent computerized transcription errors may still be present.

## 2021-12-29 NOTE — ED Triage Notes (Signed)
Patient endorses RUQ pain. Patient reports he has had some nausea without emesis. Denies diarrhea. Patient reports the pain has worsened over the last 12 hours.

## 2022-05-01 ENCOUNTER — Telehealth: Payer: Self-pay | Admitting: Gastroenterology

## 2022-05-01 ENCOUNTER — Encounter: Payer: Self-pay | Admitting: *Deleted

## 2022-05-01 MED ORDER — ALBUTEROL SULFATE HFA 108 (90 BASE) MCG/ACT IN AERS: 1.0000 | INHALATION_SPRAY | RESPIRATORY_TRACT | 1 refills | Status: DC | PRN

## 2022-05-01 NOTE — Telephone Encounter (Signed)
Questionnaire mailed to patient 

## 2022-05-01 NOTE — Telephone Encounter (Signed)
Ann, please send questionnaire to patient for triage. Thanks!

## 2022-05-01 NOTE — Telephone Encounter (Signed)
Patient reached out inquiring about updating colonoscopy as his last colonoscopy prep was inadequate. He was supposed to do it in 2019. He would like to move forward. Please triage patient and send back to me when completed.   He is also requesting refill of his Proair for asthma pending new patient appointment with PCP (pending with Dr. Everlene Other, his previous PCP retired). RX sent to [facility].

## 2022-05-07 ENCOUNTER — Ambulatory Visit: Payer: BC Managed Care – PPO | Admitting: Family Medicine

## 2022-05-07 VITALS — BP 128/84 | HR 64 | Temp 98.5°F | Ht 71.0 in | Wt 210.6 lb

## 2022-05-07 DIAGNOSIS — L209 Atopic dermatitis, unspecified: Secondary | ICD-10-CM

## 2022-05-07 DIAGNOSIS — E663 Overweight: Secondary | ICD-10-CM

## 2022-05-07 DIAGNOSIS — Z13220 Encounter for screening for lipoid disorders: Secondary | ICD-10-CM | POA: Diagnosis not present

## 2022-05-07 DIAGNOSIS — Z125 Encounter for screening for malignant neoplasm of prostate: Secondary | ICD-10-CM

## 2022-05-07 DIAGNOSIS — Z130 Encounter for screening for diseases of the blood and blood-forming organs and certain disorders involving the immune mechanism: Secondary | ICD-10-CM | POA: Diagnosis not present

## 2022-05-07 DIAGNOSIS — J4520 Mild intermittent asthma, uncomplicated: Secondary | ICD-10-CM

## 2022-05-07 DIAGNOSIS — T7840XA Allergy, unspecified, initial encounter: Secondary | ICD-10-CM

## 2022-05-07 DIAGNOSIS — Z87442 Personal history of urinary calculi: Secondary | ICD-10-CM

## 2022-05-07 DIAGNOSIS — Z0000 Encounter for general adult medical examination without abnormal findings: Secondary | ICD-10-CM

## 2022-05-07 MED ORDER — ALBUTEROL SULFATE HFA 108 (90 BASE) MCG/ACT IN AERS: 1.0000 | INHALATION_SPRAY | RESPIRATORY_TRACT | 6 refills | Status: AC | PRN

## 2022-05-07 MED ORDER — DESONIDE 0.05 % EX OINT: 1.0000 | TOPICAL_OINTMENT | Freq: Two times a day (BID) | CUTANEOUS | 1 refills | Status: AC

## 2022-05-07 NOTE — Patient Instructions (Signed)
Medication as prescribed.  Labs today.  Consider shingles vaccine.  I would proceed with the Dupixent.  Follow up annually.

## 2022-05-08 DIAGNOSIS — Z0000 Encounter for general adult medical examination without abnormal findings: Secondary | ICD-10-CM | POA: Insufficient documentation

## 2022-05-08 DIAGNOSIS — L209 Atopic dermatitis, unspecified: Secondary | ICD-10-CM | POA: Insufficient documentation

## 2022-05-08 DIAGNOSIS — T7840XA Allergy, unspecified, initial encounter: Secondary | ICD-10-CM | POA: Insufficient documentation

## 2022-05-08 DIAGNOSIS — J45909 Unspecified asthma, uncomplicated: Secondary | ICD-10-CM | POA: Insufficient documentation

## 2022-05-08 DIAGNOSIS — Z87442 Personal history of urinary calculi: Secondary | ICD-10-CM | POA: Insufficient documentation

## 2022-05-08 LAB — CBC
Hematocrit: 45 % (ref 37.5–51.0)
Hemoglobin: 15 g/dL (ref 13.0–17.7)
MCH: 30.1 pg (ref 26.6–33.0)
MCHC: 33.3 g/dL (ref 31.5–35.7)
MCV: 90 fL (ref 79–97)
Platelets: 242 10*3/uL (ref 150–450)
RBC: 4.99 x10E6/uL (ref 4.14–5.80)
RDW: 13.7 % (ref 11.6–15.4)
WBC: 7.1 10*3/uL (ref 3.4–10.8)

## 2022-05-08 LAB — CMP14+EGFR
ALT: 21 IU/L (ref 0–44)
AST: 19 IU/L (ref 0–40)
Albumin/Globulin Ratio: 1.7 (ref 1.2–2.2)
Albumin: 4.5 g/dL (ref 4.0–5.0)
Alkaline Phosphatase: 74 IU/L (ref 44–121)
BUN/Creatinine Ratio: 27 — ABNORMAL HIGH (ref 9–20)
BUN: 24 mg/dL (ref 6–24)
Bilirubin Total: 0.2 mg/dL (ref 0.0–1.2)
CO2: 24 mmol/L (ref 20–29)
Calcium: 10 mg/dL (ref 8.7–10.2)
Chloride: 100 mmol/L (ref 96–106)
Creatinine, Ser: 0.9 mg/dL (ref 0.76–1.27)
Globulin, Total: 2.6 g/dL (ref 1.5–4.5)
Glucose: 92 mg/dL (ref 70–99)
Potassium: 4.3 mmol/L (ref 3.5–5.2)
Sodium: 138 mmol/L (ref 134–144)
Total Protein: 7.1 g/dL (ref 6.0–8.5)
eGFR: 104 mL/min/{1.73_m2} (ref >59)

## 2022-05-08 LAB — LIPID PANEL
Chol/HDL Ratio: 5.2 ratio — ABNORMAL HIGH (ref 0.0–5.0)
Cholesterol, Total: 237 mg/dL — ABNORMAL HIGH (ref 100–199)
HDL: 46 mg/dL (ref >39)
LDL Chol Calc (NIH): 170 mg/dL — ABNORMAL HIGH (ref 0–99)
Triglycerides: 117 mg/dL (ref 0–149)
VLDL Cholesterol Cal: 21 mg/dL (ref 5–40)

## 2022-05-08 LAB — PSA: Prostate Specific Ag, Serum: 0.5 ng/mL (ref 0.0–4.0)

## 2022-05-08 NOTE — Assessment & Plan Note (Signed)
Continue daily antihistamine. °

## 2022-05-08 NOTE — Assessment & Plan Note (Signed)
Preventative health items discussed.  See HPI.  Labs today.

## 2022-05-08 NOTE — Assessment & Plan Note (Signed)
Stable.  Continue as needed albuterol.  Refilled today.

## 2022-05-08 NOTE — Assessment & Plan Note (Signed)
Recommended follow-up with dermatology.  Recommended to start Dupixent.  Topical desonide as needed for the face.  Advised to avoid consecutive use x2 weeks.

## 2022-05-08 NOTE — Progress Notes (Signed)
Subjective:  Patient ID: Jeremiah Fletcher, male    DOB: Jul 13, 1971  Age: 51 y.o. MRN: [id number]  CC: Chief Complaint  Patient presents with   New Patient (Initial Visit)    Needs albuterol prescription    HPI:  51 year old male with atopic dermatitis, asthma, allergy, history of kidney stones presents to establish care.  Patient's colonoscopy is going to be redone as it was incomplete.  Declines hepatitis C and HIV screening.  No additional COVID vaccines per patient.  We discussed shingles vaccine.  He is unsure of his last tetanus.  Patient has mild asthma.  Needs refill on albuterol.  Patient is followed by dermatology regarding his rather severe atopic dermatitis.  He has been recommended to start [place].  He has been on the fence about this.  He has not responded to topical therapies.  He has been treated with prednisone in the past with good results but this cannot be a long-term solution.  I recommend that he proceed with Dupixent.  Patient in need of laboratory studies.  Patient Active Problem List   Diagnosis Date Noted   Atopic dermatitis 05/08/2022   Asthma 05/08/2022   Allergy 05/08/2022   History of kidney stones 05/08/2022   Preventative health care 05/08/2022    Social Hx   Social History   Socioeconomic History   Marital status: Married    Spouse name: Not on file   Number of children: Not on file   Years of education: Not on file   Highest education level: Not on file  Occupational History   Not on file  Tobacco Use   Smoking status: Never    Passive exposure: Never   Smokeless tobacco: Never  Vaping Use   Vaping Use: Never used  Substance and Sexual Activity   Alcohol use: No   Drug use: No   Sexual activity: Yes  Other Topics Concern   Not on file  Social History Narrative   Not on file   Social Determinants of Health   Financial Resource Strain: Not on file  Food Insecurity: Not on file  Transportation Needs: Not on file  Physical  Activity: Not on file  Stress: Not on file  Social Connections: Not on file    Review of Systems  Constitutional: Negative.   Skin:  Positive for rash.   Objective:  BP 128/84   Pulse 64   Temp 98.5 F (36.9 C) (Oral)   Ht 5' 11" (1.803 m)   Wt 210 lb 9.6 oz (95.5 kg)   SpO2 97%   BMI 29.37 kg/m      05/07/2022    3:07 PM 12/29/2021   11:00 AM 12/29/2021   10:45 AM  BP/Weight  Systolic BP 528 413 244  Diastolic BP 84 86 87  Wt. (Lbs) 210.6    BMI 29.37 kg/m2      Physical Exam Constitutional:      General: He is not in acute distress.    Appearance: Normal appearance.  HENT:     Head: Normocephalic and atraumatic.  Eyes:     General:        Right eye: No discharge.        Left eye: No discharge.     Conjunctiva/sclera: Conjunctivae normal.  Cardiovascular:     Rate and Rhythm: Normal rate and regular rhythm.  Pulmonary:     Effort: Pulmonary effort is normal.     Breath sounds: Normal breath sounds. No wheezing, rhonchi or  rales.  Skin:    Comments: Extensive atopic dermatitis noted.  Neurological:     Mental Status: He is alert.  Psychiatric:        Mood and Affect: Mood normal.        Behavior: Behavior normal.     Lab Results  Component Value Date   WBC 7.1 05/07/2022   HGB 15.0 05/07/2022   HCT 45.0 05/07/2022   PLT 242 05/07/2022   GLUCOSE 92 05/07/2022   CHOL 237 (H) 05/07/2022   TRIG 117 05/07/2022   HDL 46 05/07/2022   LDLCALC 170 (H) 05/07/2022   ALT 21 05/07/2022   AST 19 05/07/2022   NA 138 05/07/2022   K 4.3 05/07/2022   CL 100 05/07/2022   CREATININE 0.90 05/07/2022   BUN 24 05/07/2022   CO2 24 05/07/2022     Assessment & Plan:   Problem List Items Addressed This Visit       Respiratory   Asthma - Primary    Stable.  Continue as needed albuterol.  Refilled today.      Relevant Medications   albuterol (PROAIR HFA) 108 (90 Base) MCG/ACT inhaler     Musculoskeletal and Integument   Atopic dermatitis    Recommended  follow-up with dermatology.  Recommended to start Dupixent.  Topical desonide as needed for the face.  Advised to avoid consecutive use x2 weeks.        Other   Preventative health care    Preventative health items discussed.  See HPI.  Labs today.      History of kidney stones   Allergy    Continue daily antihistamine.      Other Visit Diagnoses     Screening for deficiency anemia       Relevant Orders   CBC (Completed)   Screening, lipid       Relevant Orders   Lipid panel (Completed)   Screening PSA (prostate specific antigen)       Relevant Orders   PSA (Completed)   Overweight (BMI 25.0-29.9)       Relevant Orders   CMP14+EGFR (Completed)       Meds ordered this encounter  Medications   albuterol (PROAIR HFA) 108 (90 Base) MCG/ACT inhaler    Sig: Inhale 1 puff into the lungs every 4 (four) hours as needed for shortness of breath or wheezing.    Dispense:  18 g    Refill:  6   desonide (DESOWEN) 0.05 % ointment    Sig: Apply 1 Application topically 2 (two) times daily. Do not use more than 2 weeks consecutively.    Dispense:  60 g    Refill:  1    Follow-up:  Return in about 1 year (around 05/08/2023).  Jayce Cook DO [facility]  

## 2022-07-11 ENCOUNTER — Telehealth: Payer: 59 | Admitting: Nurse Practitioner

## 2022-07-11 DIAGNOSIS — J0101 Acute recurrent maxillary sinusitis: Secondary | ICD-10-CM

## 2022-07-11 MED ORDER — AMOXICILLIN-POT CLAVULANATE 875-125 MG PO TABS: 1.0000 | ORAL_TABLET | Freq: Two times a day (BID) | ORAL | 0 refills | Status: AC

## 2022-07-11 NOTE — Progress Notes (Signed)

## 2022-07-22 ENCOUNTER — Encounter: Payer: Self-pay | Admitting: Family Medicine

## 2022-07-22 ENCOUNTER — Other Ambulatory Visit: Payer: Self-pay | Admitting: Family Medicine

## 2022-07-22 MED ORDER — NIRMATRELVIR/RITONAVIR (PAXLOVID)TABLET: 3.0000 | ORAL_TABLET | Freq: Two times a day (BID) | ORAL | 0 refills | Status: AC
